# Patient Record
Sex: Female | Born: 1935 | Race: White | Hispanic: No
Health system: Southern US, Community
[De-identification: ages and names within clinical notes are randomized; demographics above are authoritative.]

---

## 2014-03-09 ENCOUNTER — Other Ambulatory Visit (HOSPITAL_COMMUNITY): Payer: Self-pay

## 2014-03-09 ENCOUNTER — Inpatient Hospital Stay
Admission: AD | Admit: 2014-03-09 | Discharge: 2014-03-28 | Disposition: A | Discharge status: Skilled Nursing Facility | Payer: Self-pay | Source: Ambulatory Visit | Attending: Internal Medicine | Admitting: Internal Medicine

## 2014-03-09 DIAGNOSIS — K259 Gastric ulcer, unspecified as acute or chronic, without hemorrhage or perforation: Secondary | ICD-10-CM

## 2014-03-09 DIAGNOSIS — R633 Feeding difficulties, unspecified: Secondary | ICD-10-CM

## 2014-03-09 DIAGNOSIS — T85598D Other mechanical complication of other gastrointestinal prosthetic devices, implants and grafts, subsequent encounter: Secondary | ICD-10-CM

## 2014-03-09 DIAGNOSIS — I639 Cerebral infarction, unspecified: Secondary | ICD-10-CM

## 2014-03-09 DIAGNOSIS — S064X9A Epidural hemorrhage with loss of consciousness of unspecified duration, initial encounter: Secondary | ICD-10-CM

## 2014-03-09 DIAGNOSIS — S064X1A Epidural hemorrhage with loss of consciousness of 30 minutes or less, initial encounter: Secondary | ICD-10-CM

## 2014-03-09 DIAGNOSIS — K2970 Gastritis, unspecified, without bleeding: Secondary | ICD-10-CM

## 2014-03-09 DIAGNOSIS — Z4659 Encounter for fitting and adjustment of other gastrointestinal appliance and device: Secondary | ICD-10-CM

## 2014-03-09 DIAGNOSIS — K2990 Gastroduodenitis, unspecified, without bleeding: Secondary | ICD-10-CM

## 2014-03-09 DIAGNOSIS — S0190XA Unspecified open wound of unspecified part of head, initial encounter: Secondary | ICD-10-CM

## 2014-03-09 DIAGNOSIS — T85598A Other mechanical complication of other gastrointestinal prosthetic devices, implants and grafts, initial encounter: Secondary | ICD-10-CM

## 2014-03-10 ENCOUNTER — Other Ambulatory Visit (HOSPITAL_COMMUNITY): Payer: Self-pay

## 2014-03-10 LAB — COMPREHENSIVE METABOLIC PANEL
ALBUMIN: 2.5 g/dL — AB (ref 3.5–5.2)
ALK PHOS: 113 U/L (ref 39–117)
ALT: 31 U/L (ref 0–35)
AST: 37 U/L (ref 0–37)
Anion gap: 12 (ref 5–15)
BILIRUBIN TOTAL: 0.9 mg/dL (ref 0.3–1.2)
BUN: 15 mg/dL (ref 6–23)
CO2: 22 mEq/L (ref 19–32)
Calcium: 8.3 mg/dL — ABNORMAL LOW (ref 8.4–10.5)
Chloride: 100 mEq/L (ref 96–112)
Creatinine, Ser: 0.56 mg/dL (ref 0.50–1.10)
GFR calc non Af Amer: 86 mL/min — ABNORMAL LOW (ref >90)
Glucose, Bld: 156 mg/dL — ABNORMAL HIGH (ref 70–99)
POTASSIUM: 4.4 meq/L (ref 3.7–5.3)
Sodium: 134 mEq/L — ABNORMAL LOW (ref 137–147)
TOTAL PROTEIN: 5.8 g/dL — AB (ref 6.0–8.3)

## 2014-03-10 LAB — CBC
HCT: 35 % — ABNORMAL LOW (ref 36.0–46.0)
Hemoglobin: 11.7 g/dL — ABNORMAL LOW (ref 12.0–15.0)
MCH: 31.7 pg (ref 26.0–34.0)
MCHC: 33.4 g/dL (ref 30.0–36.0)
MCV: 94.9 fL (ref 78.0–100.0)
Platelets: 288 10*3/uL (ref 150–400)
RBC: 3.69 MIL/uL — ABNORMAL LOW (ref 3.87–5.11)
RDW: 14.1 % (ref 11.5–15.5)
WBC: 11.8 10*3/uL — ABNORMAL HIGH (ref 4.0–10.5)

## 2014-03-10 LAB — TSH: TSH: 7.59 u[IU]/mL — ABNORMAL HIGH (ref 0.350–4.500)

## 2014-03-12 ENCOUNTER — Other Ambulatory Visit (HOSPITAL_COMMUNITY): Payer: Self-pay

## 2014-03-13 LAB — CBC
HCT: 36.5 % (ref 36.0–46.0)
Hemoglobin: 11.9 g/dL — ABNORMAL LOW (ref 12.0–15.0)
MCH: 31.7 pg (ref 26.0–34.0)
MCHC: 32.6 g/dL (ref 30.0–36.0)
MCV: 97.3 fL (ref 78.0–100.0)
PLATELETS: 313 10*3/uL (ref 150–400)
RBC: 3.75 MIL/uL — ABNORMAL LOW (ref 3.87–5.11)
RDW: 14.2 % (ref 11.5–15.5)
WBC: 8.6 10*3/uL (ref 4.0–10.5)

## 2014-03-13 LAB — BASIC METABOLIC PANEL
ANION GAP: 12 (ref 5–15)
BUN: 15 mg/dL (ref 6–23)
CO2: 23 mEq/L (ref 19–32)
CREATININE: 0.57 mg/dL (ref 0.50–1.10)
Calcium: 8.5 mg/dL (ref 8.4–10.5)
Chloride: 102 mEq/L (ref 96–112)
GFR calc Af Amer: >90 mL/min (ref >90)
GFR, EST NON AFRICAN AMERICAN: 85 mL/min — AB (ref >90)
Glucose, Bld: 123 mg/dL — ABNORMAL HIGH (ref 70–99)
POTASSIUM: 4.6 meq/L (ref 3.7–5.3)
Sodium: 137 mEq/L (ref 137–147)

## 2014-03-22 DIAGNOSIS — R633 Feeding difficulties, unspecified: Secondary | ICD-10-CM

## 2014-03-22 DIAGNOSIS — K259 Gastric ulcer, unspecified as acute or chronic, without hemorrhage or perforation: Secondary | ICD-10-CM

## 2014-03-22 NOTE — Consult Note (Signed)
[facility] Consult: 12:31 PM 03/22/2014  LOS: 13 days    Referring Provider: Dr Sharyon MedicusHijazi.   Primary Care Physician:  Pcp Not In System Primary Gastroenterologist:  [facility] consult .  Seen by Dr Octaviano GlowBadreddine at [facility].     Reason for Consultation:  Follow up of ulcers and place PEG.    HPI: Martha Casey is a 80 y.o. female.  History of hypothyroidism. History of atrial fibrillation but not on anticoagulation at time of presentation with acute CVA to outside hospital. She was admitted for the stroke as well as associated complications at outside hospital from 03/01/2014 until [facility]/09/[facility]  CT and MRI imaging of the brain confirmed multifocal acute infarcts left hemispheric involvement greater than right hemispheric involvement. While hospitalized she had dysphagia and associated aspiration pneumonia treated with 6 days of Zosyn. During the hospitalization she underwent EGD with the aim to place a PEG tube. However at the endoscopy she had a large gastric ulcer and 2 duodenal ulcers.  These precluded placement of PEG suggestion by GI was to treat the ulcers with twice daily PPI and repeat endoscopy in about 2 weeks to reassess the ulcers and possibly place PEG at that time. She has been on ongoing twice daily Protonix since the second week of November.  A serum H. pylori antibody was ordered and is in the negative range at less than 0.40.  Patient is aphasic and unable to speak, unable to follow commands. She is tolerating Dobbhoff tube feeds without problems. Contact number for a daughter found in the patient's chart is a Alain HoneyCheryl Dahr, cell phone number is [phone]. Work number is [phone]. Home phone is [phone]    Past medical  history Hypothyroidism Stroke likely embolic etiology from her atrial fibrillation. CVA associated encephalopathy. IDDM Peptic ulcer disease, 11 2015 EGD revealed large gastric and 2 duodenal ulcers. Atrial fibrillation, chronic not on anticoagulation. Neurogenic dysphasia following stroke in 11 2015 Aspiration pneumonia associated with neurogenic dysphagia   Past surgical history EGD in October 2015. 2-D echocardiogram in November 2015 at outside hospital showed normal EF, mild concentric LVH.  No other surgeries mentioned in her history  Prior to Admission medications   Not on File    Scheduled Meds: NovoLog insulin, atorvastatin, multivitamin, diltiazem, fish oil, levothyroxine, melatonin, metoprolol, modafinil, Protonix 40 mg via tube twice a day, probiotic Risa-BID.      Allergies as of 03/09/2014  . (Not on File)    No family history on file.  History   Unable to obtain any history from the patient.  Social History  . Marital Status: Widowed    Spouse Name: N/A    Number of Children: N/A  . Years of Education: N/A   Occupational History  . Not on file.   Social History Main Topics  . Smoking status: Not on file  . Smokeless tobacco: Not on file  . Alcohol Use: Not on file  . Drug Use: Not on file  . Sexual Activity: Not on file  REVIEW OF SYSTEMS: Unable to complete review of systems as the patient is aphasic  PHYSICAL EXAM: Vital signs in last 24 hours: Temperature 98.2. Pulse 100. Respirations 22. Blood pressure 146/70. Weight 124.6 # General: Thin, not cachectic, older white female who looks younger than her stated age. Head:  No signs of trauma, no asymmetry, no swelling  Eyes:  No icterus, no pallor Ears:  Unable to assess hearing though it seems to be intact she's just not following any commands  Nose:  No congestion or discharge there is a Dobbhoff tube in place Mouth:  She doesn't open her mouth at my request I did not pry it open for  exam.  No lesions on the lips and no dried blood at the external mouth Neck:  No JVD, no masses, no TMJ Lungs:  Clear to auscultation and percussion bilaterally. No cough and no labored breathing Heart: Irregularly irregular. No murmur rub or gallop Abdomen:  Soft, thin, no scars, no masses. Bowel sounds active. No organomegaly. No distention.   Rectal: Not performed   Musc/Skeltl: No joint swelling, redness or contracture. Extremities:  No pedal or lower extremity edema. Slight edema in the right upper extremity  Neurologic:  Flaccid right arm. Unable to assess strength on the left leg Tattoos:   None Nodes:   No cervical adenopathy   Psych:   Not anxious or agitated.  Intake/Output from previous day:   Intake/Output this shift:    LAB RESULTS: No results for input(s): WBC, HGB, HCT, PLT in the last 72 hours. BMET Lab Results  Component Value Date   NA 137 03/13/2014   NA 134* 03/10/2014   K 4.6 03/13/2014   K 4.4 03/10/2014   CL 102 03/13/2014   CL 100 03/10/2014   CO2 23 03/13/2014   CO2 22 03/10/2014   GLUCOSE 123* 03/13/2014   GLUCOSE 156* 03/10/2014   BUN 15 03/13/2014   BUN 15 03/10/2014   CREATININE 0.57 03/13/2014   CREATININE 0.56 03/10/2014   CALCIUM 8.5 03/13/2014   CALCIUM 8.3* 03/10/2014   LFT No results for input(s): PROT, ALBUMIN, AST, ALT, ALKPHOS, BILITOT, BILIDIR, IBILI in the last 72 hours. PT/INR No results found for: INR, PROTIME Hepatitis Panel No results for input(s): HEPBSAG, HCVAB, HEPAIGM, HEPBIGM in the last 72 hours. C-Diff No components found for: CDIFF Lipase  No results found for: LIPASE  Drugs of Abuse  No results found for: LABOPIA, COCAINSCRNUR, LABBENZ, AMPHETMU, THCU, LABBARB   RADIOLOGY STUDIES: No results found.  ENDOSCOPIC STUDIES: Per history of present illness  IMPRESSION:   *   Recent  Ulcer disease on EGD  *  Dysphagia.  MD requesting PEG    PLAN:     *  Will arrange for upper endoscopy the soonest we  could feasibly do this would be 1127 however it might be 1129 or 11:30 before we are able to perform this. There is no imminent discharge pending on this patient so we have several days to work out the endoscopy timing. *  Continue PPI via tube as doing   Jennye MoccasinSarah Gribbin  03/22/2014, 12:31 PM Pager: [phone]  GI Attending Note   Chart was reviewed and patient was examined. X-rays and lab were reviewed.    I agree with management and plans. Plan EGD/possible PEG on 11/27.  Barbette Hairobert D. Arlyce DiceKaplan, M.D., [facility] Cell [phone]

## 2014-03-23 LAB — CLOSTRIDIUM DIFFICILE BY PCR: CDIFFPCR: NEGATIVE

## 2014-03-24 ENCOUNTER — Encounter (HOSPITAL_COMMUNITY): Payer: Self-pay | Admitting: Anesthesiology

## 2014-03-24 ENCOUNTER — Encounter
Admission: AD | Disposition: A | Discharge status: Skilled Nursing Facility | Payer: Self-pay | Source: Ambulatory Visit | Attending: Internal Medicine

## 2014-03-24 ENCOUNTER — Encounter (HOSPITAL_COMMUNITY): Payer: Self-pay | Admitting: Gastroenterology

## 2014-03-24 DIAGNOSIS — I639 Cerebral infarction, unspecified: Secondary | ICD-10-CM

## 2014-03-24 DIAGNOSIS — K2970 Gastritis, unspecified, without bleeding: Secondary | ICD-10-CM

## 2014-03-24 DIAGNOSIS — K2990 Gastroduodenitis, unspecified, without bleeding: Secondary | ICD-10-CM

## 2014-03-24 HISTORY — PX: PEG PLACEMENT: SHX5437

## 2014-03-24 HISTORY — PX: ESOPHAGOGASTRODUODENOSCOPY: SHX5428

## 2014-03-24 SURGERY — EGD (ESOPHAGOGASTRODUODENOSCOPY)
Anesthesia: Monitor Anesthesia Care

## 2014-03-24 MED ORDER — SODIUM CHLORIDE 0.9 % IV SOLN
3.0000 g | INTRAVENOUS | Status: DC | PRN
  Administered 2014-03-24: 1.5 g via INTRAVENOUS

## 2014-03-24 MED ORDER — PROPOFOL INFUSION 10 MG/ML OPTIME
INTRAVENOUS | Status: DC | PRN
  Administered 2014-03-24: 75 ug/kg/min via INTRAVENOUS

## 2014-03-24 MED ORDER — LACTATED RINGERS IV SOLN
INTRAVENOUS | Status: DC | PRN
  Administered 2014-03-24: 13:00:00 via INTRAVENOUS

## 2014-03-24 MED ORDER — FENTANYL CITRATE 0.05 MG/ML IJ SOLN
INTRAMUSCULAR | Status: DC | PRN
  Administered 2014-03-24 (×2): 25 ug via INTRAVENOUS

## 2014-03-24 MED ORDER — PROPOFOL 10 MG/ML IV BOLUS: INTRAVENOUS | Status: DC | PRN

## 2014-03-24 MED ORDER — SODIUM CHLORIDE 0.9 % IV SOLN: INTRAVENOUS | Status: DC

## 2014-03-24 NOTE — Interval H&P Note (Signed)
History and Physical Interval Note:  03/24/2014 1:25 PM  Martha CurryKathleen R Whinery  has presented today for surgery, with the diagnosis of Ulcer disease needs reevaluation.  If ulcer disease has regressed, M.D. Will place gastrostomy tube  The various methods of treatment have been discussed with the patient and family. After consideration of risks, benefits and other options for treatment, the patient has consented to  Procedure(s): ESOPHAGOGASTRODUODENOSCOPY (EGD) (N/A) PERCUTANEOUS ENDOSCOPIC GASTROSTOMY (PEG) PLACEMENT (N/A) as a surgical intervention .  The patient's history has been reviewed, patient examined, no change in status, stable for surgery.  I have reviewed the patient's chart and labs.  Questions were answered to the patient's satisfaction.    The recent H&P (dated *03/22/14**) was reviewed, the patient was examined and there is no change in the patients condition since that H&P was completed.   [name]  03/24/2014, 1:25 PM   [name]

## 2014-03-24 NOTE — Transfer of Care (Signed)
Immediate Anesthesia Transfer of Care Note  Patient: Donnalee CurryKathleen R Tarr  Procedure(s) Performed: Procedure(s): ESOPHAGOGASTRODUODENOSCOPY (EGD) (N/A) PERCUTANEOUS ENDOSCOPIC GASTROSTOMY (PEG) PLACEMENT (N/A)  Patient Location: Endoscopy Unit  Anesthesia Type:MAC  Level of Consciousness: awake  Airway & Oxygen Therapy: Patient Spontanous Breathing  Post-op Assessment: Report given to PACU RN  Post vital signs: Reviewed and stable  Complications: No apparent anesthesia complications

## 2014-03-24 NOTE — H&P (View-Only) (Signed)
[facility] Consult: 12:31 PM 03/22/2014  LOS: 13 days    Referring Provider: Dr Sharyon MedicusHijazi.   Primary Care Physician:  Pcp Not In System Primary Gastroenterologist:  [facility] consult .  Seen by Dr Octaviano GlowBadreddine at [facility].     Reason for Consultation:  Follow up of ulcers and place PEG.    HPI: Martha Casey is a 80 y.o. female.  History of hypothyroidism. History of atrial fibrillation but not on anticoagulation at time of presentation with acute CVA to outside hospital. She was admitted for the stroke as well as associated complications at outside hospital from 03/01/2014 until [facility]/09[facility]  CT and MRI imaging of the brain confirmed multifocal acute infarcts left hemispheric involvement greater than right hemispheric involvement. While hospitalized she had dysphagia and associated aspiration pneumonia treated with 6 days of Zosyn. During the hospitalization she underwent EGD with the aim to place a PEG tube. However at the endoscopy she had a large gastric ulcer and 2 duodenal ulcers.  These precluded placement of PEG suggestion by GI was to treat the ulcers with twice daily PPI and repeat endoscopy in about 2 weeks to reassess the ulcers and possibly place PEG at that time. She has been on ongoing twice daily Protonix since the second week of November.  A serum H. pylori antibody was ordered and is in the negative range at less than 0.40.  Patient is aphasic and unable to speak, unable to follow commands. She is tolerating Dobbhoff tube feeds without problems. Contact number for a daughter found in the patient's chart is a Alain HoneyCheryl Dahr, cell phone number is [phone]. Work number is [phone]. Home phone is [phone]    Past medical  history Hypothyroidism Stroke likely embolic etiology from her atrial fibrillation. CVA associated encephalopathy. IDDM Peptic ulcer disease, 11 2015 EGD revealed large gastric and 2 duodenal ulcers. Atrial fibrillation, chronic not on anticoagulation. Neurogenic dysphasia following stroke in 11 2015 Aspiration pneumonia associated with neurogenic dysphagia   Past surgical history EGD in October 2015. 2-D echocardiogram in November 2015 at outside hospital showed normal EF, mild concentric LVH.  No other surgeries mentioned in her history  Prior to Admission medications   Not on File    Scheduled Meds: NovoLog insulin, atorvastatin, multivitamin, diltiazem, fish oil, levothyroxine, melatonin, metoprolol, modafinil, Protonix 40 mg via tube twice a day, probiotic Risa-BID.      Allergies as of 03/09/2014  . (Not on File)    No family history on file.  History   Unable to obtain any history from the patient.  Social History  . Marital Status: Widowed    Spouse Name: N/A    Number of Children: N/A  . Years of Education: N/A   Occupational History  . Not on file.   Social History Main Topics  . Smoking status: Not on file  . Smokeless tobacco: Not on file  . Alcohol Use: Not on file  . Drug Use: Not on file  . Sexual Activity: Not on file  REVIEW OF SYSTEMS: Unable to complete review of systems as the patient is aphasic  PHYSICAL EXAM: Vital signs in last 24 hours: Temperature 98.2. Pulse 100. Respirations 22. Blood pressure 146/70. Weight 124.6 # General: Thin, not cachectic, older white female who looks younger than her stated age. Head:  No signs of trauma, no asymmetry, no swelling  Eyes:  No icterus, no pallor Ears:  Unable to assess hearing though it seems to be intact she's just not following any commands  Nose:  No congestion or discharge there is a Dobbhoff tube in place Mouth:  She doesn't open her mouth at my request I did not pry it open for  exam.  No lesions on the lips and no dried blood at the external mouth Neck:  No JVD, no masses, no TMJ Lungs:  Clear to auscultation and percussion bilaterally. No cough and no labored breathing Heart: Irregularly irregular. No murmur rub or gallop Abdomen:  Soft, thin, no scars, no masses. Bowel sounds active. No organomegaly. No distention.   Rectal: Not performed   Musc/Skeltl: No joint swelling, redness or contracture. Extremities:  No pedal or lower extremity edema. Slight edema in the right upper extremity  Neurologic:  Flaccid right arm. Unable to assess strength on the left leg Tattoos:   None Nodes:   No cervical adenopathy   Psych:   Not anxious or agitated.  Intake/Output from previous day:   Intake/Output this shift:    LAB RESULTS: No results for input(s): WBC, HGB, HCT, PLT in the last 72 hours. BMET Lab Results  Component Value Date   NA 137 03/13/2014   NA 134* 03/10/2014   K 4.6 03/13/2014   K 4.4 03/10/2014   CL 102 03/13/2014   CL 100 03/10/2014   CO2 23 03/13/2014   CO2 22 03/10/2014   GLUCOSE 123* 03/13/2014   GLUCOSE 156* 03/10/2014   BUN 15 03/13/2014   BUN 15 03/10/2014   CREATININE 0.57 03/13/2014   CREATININE 0.56 03/10/2014   CALCIUM 8.5 03/13/2014   CALCIUM 8.3* 03/10/2014   LFT No results for input(s): PROT, ALBUMIN, AST, ALT, ALKPHOS, BILITOT, BILIDIR, IBILI in the last 72 hours. PT/INR No results found for: INR, PROTIME Hepatitis Panel No results for input(s): HEPBSAG, HCVAB, HEPAIGM, HEPBIGM in the last 72 hours. C-Diff No components found for: CDIFF Lipase  No results found for: LIPASE  Drugs of Abuse  No results found for: LABOPIA, COCAINSCRNUR, LABBENZ, AMPHETMU, THCU, LABBARB   RADIOLOGY STUDIES: No results found.  ENDOSCOPIC STUDIES: Per history of present illness  IMPRESSION:   *   Recent  Ulcer disease on EGD  *  Dysphagia.  MD requesting PEG    PLAN:     *  Will arrange for upper endoscopy the soonest we  could feasibly do this would be 1127 however it might be 1129 or 11:30 before we are able to perform this. There is no imminent discharge pending on this patient so we have several days to work out the endoscopy timing. *  Continue PPI via tube as doing   Sarah Gribbin  03/22/2014, 12:31 PM Pager: [phone]  GI Attending Note   Chart was reviewed and patient was examined. X-rays and lab were reviewed.    I agree with management and plans. Plan EGD/possible PEG on 11/27.  Gelila Well D. [place] Perra, M.D., FACG [facility] Cell [phone]       

## 2014-03-24 NOTE — Op Note (Signed)
[facility] [address] [place], [id number]   EGD WITH PEG PROCEDURE REPORT        EXAM DATE: 03/24/2014  PATIENT NAME:          Martha Casey, Martha Casey          MR #: [id number] BIRTHDATE:       11-Mar-1931     VISIT #:     [id number] ATTENDING:     Louis Meckelobert D Landen Breeland, MD     STATUS:     inpatient ASSISTANT:      Dwain SarnaFord, Patricia and Oletha BlendShoffner, Davida  INDICATIONS:  The patient is a 80 yr old female here for an EGD with PEG due to feeding difficulties. PROCEDURE PERFORMED:     EGD w/ percutaneous gastrostomy tube placement  MEDICATIONS:     Monitored anesthesia care TOPICAL ANESTHETIC:  CONSENT: The patient understands the risks and benefits of the procedure and understands that these risks include, but are not limited to: sedation, allergic reaction, infection, perforation and/or bleeding. Alternative means of evaluation and treatment include, among others: physical exam, x-rays, and/or surgical intervention. The patient elects to proceed with this endoscopic procedure.  DESCRIPTION OF PROCEDURE: During intra-op preparation period all mechanical & medical equipment was checked for proper function. Hand hygiene and appropriate measures for infection prevention was taken. After the risks, benefits and alternatives of the procedure were thoroughly explained, Informed consent was verified, confirmed and timeout was successfully executed by the treatment team. The patient was anesthetized with topical anesthesia and the    endoscope was introduced through the mouth and advanced to the second portion of the duodenum.  The instrument was slowly withdrawn as the mucosa was fully examined.  STOMACH: Congested chronic gastritis (inflammation) was found in the gastric fundus.   Except for the findings listed the EGD was otherwise normal. The stomach was then inflated with air, and by a combination of transillumination and manual palpation, the site for  the gastrostomy tube placement was selected and marked on the anterior abdominal wall.  The skin of the anterior abdomen was surgically prepped and draped with sterile towels.  Utilizing strict sterile technique, the selected site was then anesthetized with 1% xylocaine by injection into the skin and subcutaneous tissue.  A 1 cm incision was made through the skin and subcutaneous tissue, and the needle/cannula assembly was then passed through the abdominal wall and through the anterior wall of the stomach, maintaining visualization with the endoscope.  A snare device previously placed through the instrument channel was then opened and placed around the cannula, the needle was removed, and the insertion wire was passed through the cannula and into the stomach lumen.  The snare was then loosened from the cannula, and repositioned to snare the insertion wire.  The snare was then pulled up to the endoscope distal tip, and the scope was then withdrawn bringing with it the snare and insertion wire.  The insertion wire was then released from the snare, and then loop-attached to the Bard 22 Fr gastrostomy tube.  Using the "pull technique", the G-tube was then pulled into place by traction on the insertion wire at the abdominal wall end.        The G-tube insertion site was then cleansed once again, and the external bolster was placed over the tube to secure it to the abdominal wall.  A sterile dressing was then applied, and the procedure terminated. Retroflexed views revealed no abnormalities.  The gastroscope was  then slowly withdrawn and removed.    ADVERSE EVENT:     There were no immediate complications. IMPRESSIONS:     1.  Chronic gastritis (inflammation) was found in the gastric fundus 2.  EGD was otherwise normal 3.  s/p PEG placement  RECOMMENDATIONS:     Resume tube feedings in am REPEAT EXAM:   ___________________________________ Louis Meckelobert D Bradon Fester, MD eSigned:  Louis Meckelobert D  Pansie Guggisberg, MD 03/24/2014 2:23 PM   cc:  CPT CODES: ICD CODES:  The ICD and CPT codes recommended by this software are interpretations from the data that the clinical staff has captured with the software.  The verification of the translation of this report to the ICD and CPT codes and modifiers is the sole responsibility of the health care institution and practicing physician where this report was generated.  [employer] will not be held responsible for the validity of the ICD and CPT codes included on this report.  AMA assumes no liability for data contained or not contained herein. CPT is a Publishing rights managerregistered trademark of the [employer].  PATIENT NAME:  Martha Casey, Martha Casey MR#: [id number]

## 2014-03-24 NOTE — Anesthesia Preprocedure Evaluation (Addendum)
Anesthesia Evaluation  Patient identified by MRN, date of birth, ID band Patient awake and Patient confused  General Assessment Comment:noncommunicative after CVA  Airway Mallampati: III       Dental   Pulmonary  Hx of aspiration pneumonia   + decreased breath sounds      Cardiovascular + dysrhythmias Atrial Fibrillation Rhythm:Irregular Rate:Tachycardia  Apparently untreated before and unclear [facility]   Neuro/Psych CVA, Residual Symptoms    GI/Hepatic PUD, Tube feedings   Endo/Other    Renal/GU      Musculoskeletal   Abdominal   Peds  Hematology   Anesthesia Other Findings   Reproductive/Obstetrics                             Anesthesia Physical Anesthesia Plan  ASA: IV  Anesthesia Plan: MAC   Post-op Pain Management:    Induction: Intravenous  Airway Management Planned: Natural Airway  Additional Equipment:   Intra-op Plan:   Post-operative Plan:   Informed Consent: I have reviewed the patients History and Physical, chart, labs and discussed the procedure including the risks, benefits and alternatives for the proposed anesthesia with the patient or authorized representative who has indicated his/her understanding and acceptance.     Plan Discussed with: CRNA, Surgeon and Anesthesiologist  Anesthesia Plan Comments: (Plan discussed c son in attendance including possiblity of intubation if airway issue encounterd. Plan sedation.)      Anesthesia Quick Evaluation

## 2014-03-24 NOTE — Anesthesia Postprocedure Evaluation (Signed)
  Anesthesia Post-op Note  Patient: Martha Casey  Procedure(s) Performed: Procedure(s): ESOPHAGOGASTRODUODENOSCOPY (EGD) (N/A) PERCUTANEOUS ENDOSCOPIC GASTROSTOMY (PEG) PLACEMENT (N/A)  Patient Location: Endoscopy Unit  Anesthesia Type:MAC  Level of Consciousness: awake  Airway and Oxygen Therapy: Patient Spontanous Breathing and Patient connected to nasal cannula oxygen  Post-op Pain: none  Post-op Assessment: Post-op Vital signs reviewed  Post-op Vital Signs: Reviewed and stable  Last Vitals:  Filed Vitals:   03/24/14 1336  BP:   Pulse: 92  Resp: 15    Complications: No apparent anesthesia complications

## 2014-03-24 NOTE — Progress Notes (Signed)
PEG placed.  May resume feedings in am.

## 2014-03-26 LAB — CBC
HCT: 35 % — ABNORMAL LOW (ref 36.0–46.0)
HEMOGLOBIN: 11.2 g/dL — AB (ref 12.0–15.0)
MCH: 30 pg (ref 26.0–34.0)
MCHC: 32 g/dL (ref 30.0–36.0)
MCV: 93.8 fL (ref 78.0–100.0)
Platelets: 235 10*3/uL (ref 150–400)
RBC: 3.73 MIL/uL — AB (ref 3.87–5.11)
RDW: 14.9 % (ref 11.5–15.5)
WBC: 9.2 10*3/uL (ref 4.0–10.5)

## 2014-03-26 LAB — BASIC METABOLIC PANEL
Anion gap: 13 (ref 5–15)
BUN: 19 mg/dL (ref 6–23)
CO2: 26 meq/L (ref 19–32)
Calcium: 8.9 mg/dL (ref 8.4–10.5)
Chloride: 95 mEq/L — ABNORMAL LOW (ref 96–112)
Creatinine, Ser: 0.5 mg/dL (ref 0.50–1.10)
GFR calc Af Amer: >90 mL/min (ref >90)
GFR calc non Af Amer: 89 mL/min — ABNORMAL LOW (ref >90)
Glucose, Bld: 134 mg/dL — ABNORMAL HIGH (ref 70–99)
POTASSIUM: 4.2 meq/L (ref 3.7–5.3)
SODIUM: 134 meq/L — AB (ref 137–147)

## 2014-03-26 LAB — PROTIME-INR
INR: 1.23 (ref 0.00–1.49)
PROTHROMBIN TIME: 15.7 s — AB (ref 11.6–15.2)

## 2014-03-27 ENCOUNTER — Encounter (HOSPITAL_COMMUNITY): Payer: Self-pay | Admitting: Gastroenterology

## 2014-03-27 ENCOUNTER — Other Ambulatory Visit (HOSPITAL_COMMUNITY): Payer: Self-pay

## 2014-05-29 DEATH — deceased

## 2015-07-22 IMAGING — CR DG ABD PORTABLE 1V
1 series · 1 of 1 positions shown · non-contrast
Comparison: 03/09/2014

CLINICAL DATA: Feeding tube blocked

EXAM:
PORTABLE ABDOMEN - 1 VIEW

[AP]
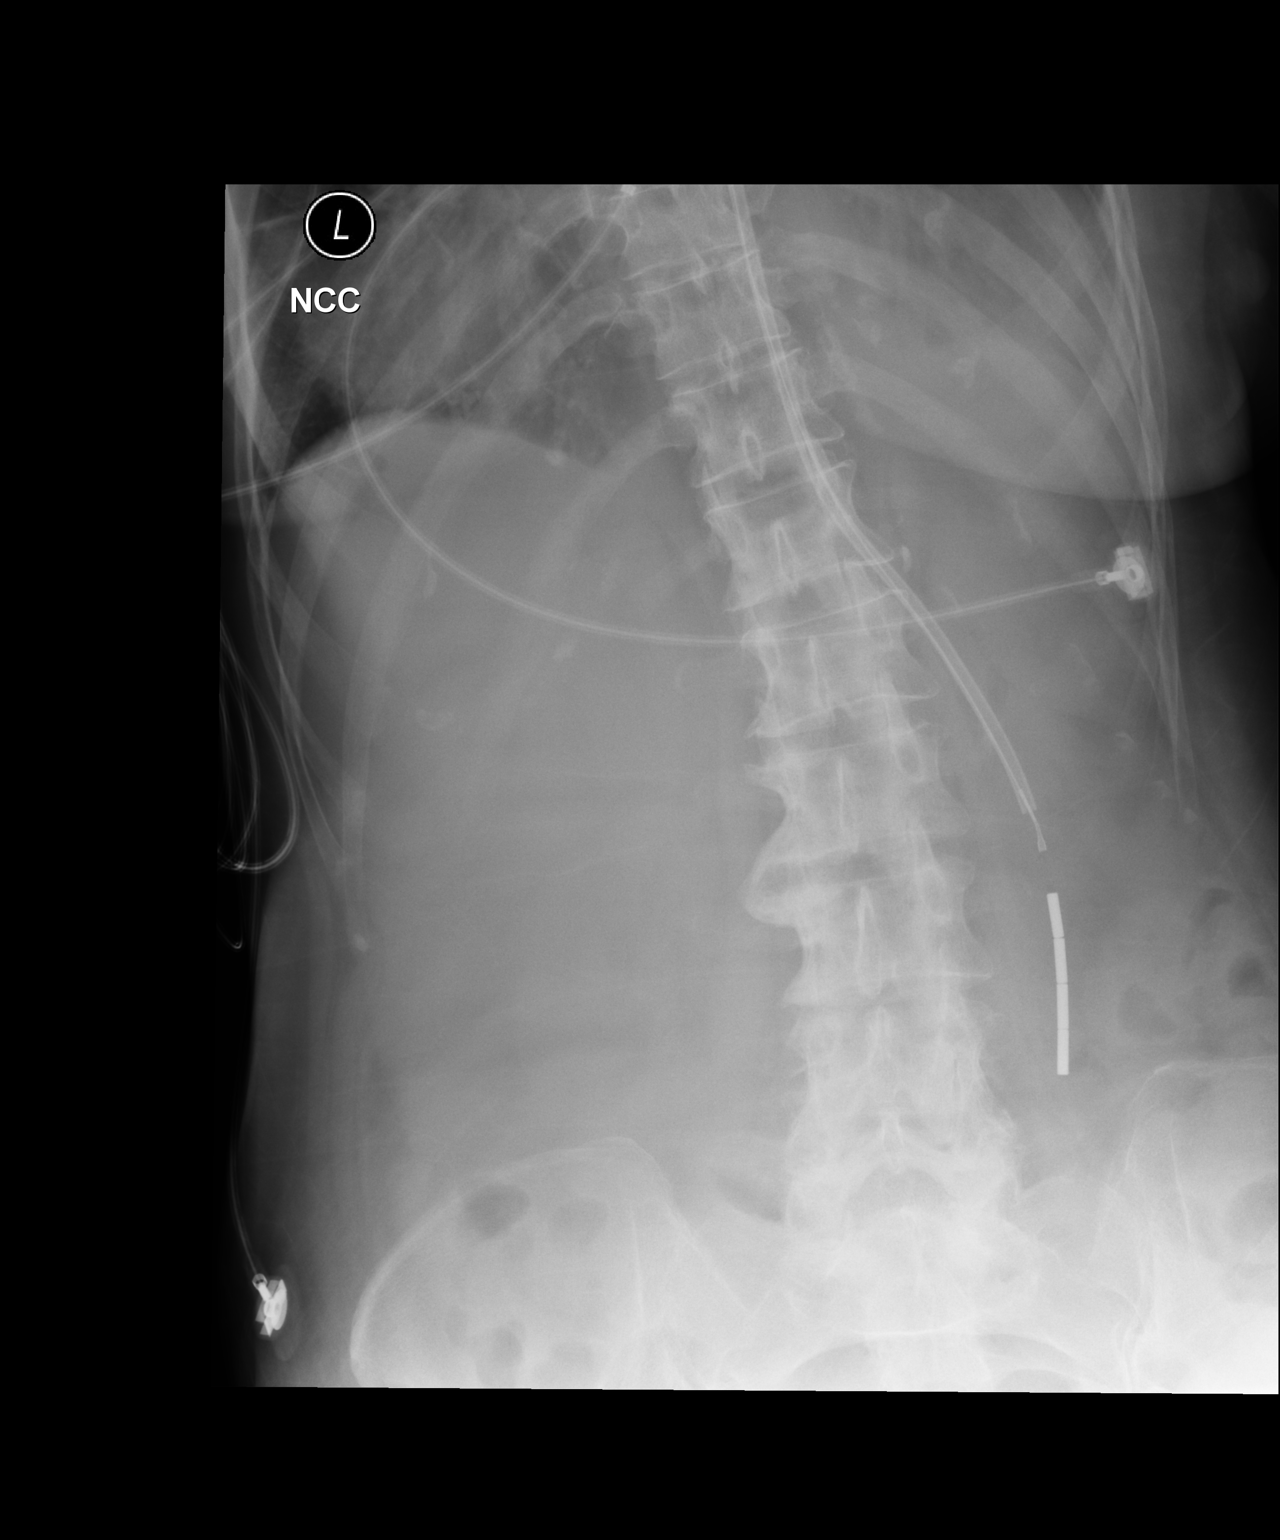

[1 of 1 positions shown; findings below may reference images not displayed]

FINDINGS: There is a feeding tube present with the metallic portion projecting
over the body of the stomach. There is a relative paucity of bowel
gas. There is no bowel dilatation to suggest obstruction. There is
no evidence of pneumoperitoneum, portal venous gas or pneumatosis.
There are no pathologic calcifications along the expected course of
the ureters.The osseous structures are unremarkable.
IMPRESSION: Feeding tube with the metallic portion projecting over these body of
the stomach.

## 2015-08-06 IMAGING — RF DG SWALLOWING FUNCTION - NRPT [facility]
1 series · 14 of 24 positions shown · non-contrast
Comparison: none

[Series 1: run · 13 acquisitions, 14 frames shown]
[im 1/13]
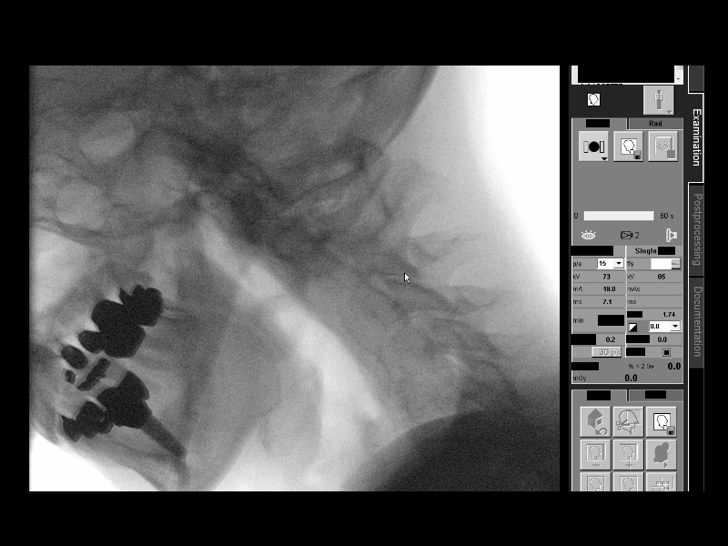
[im 2/13]
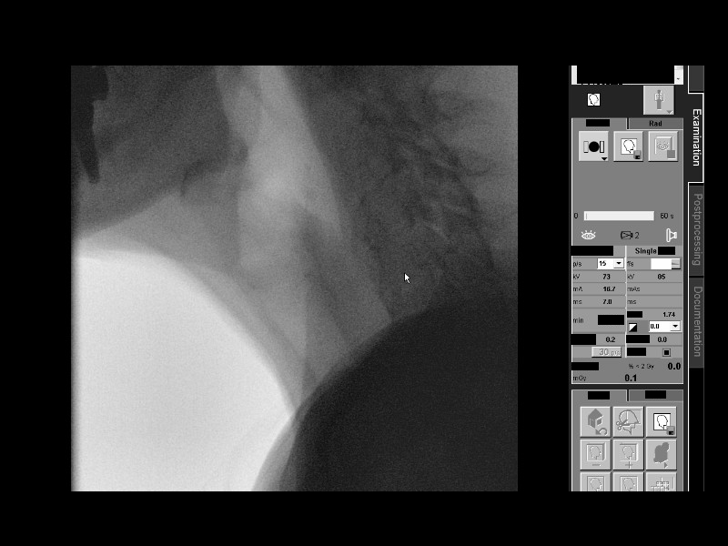
[im 3/13]
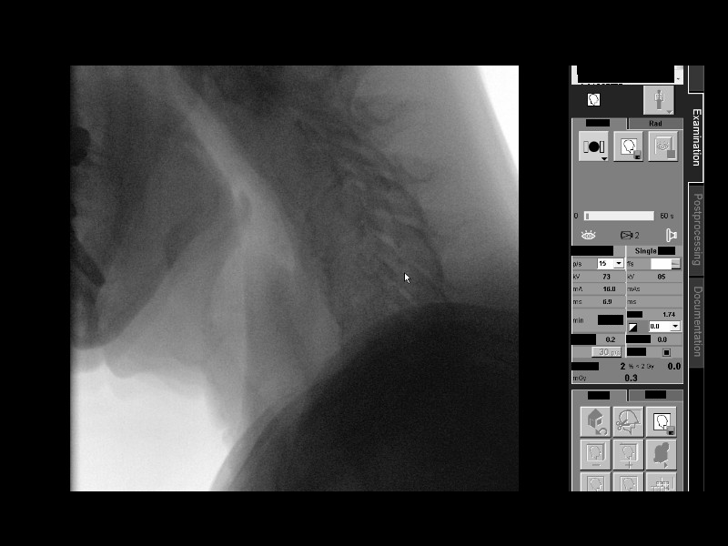
[im 4/13]
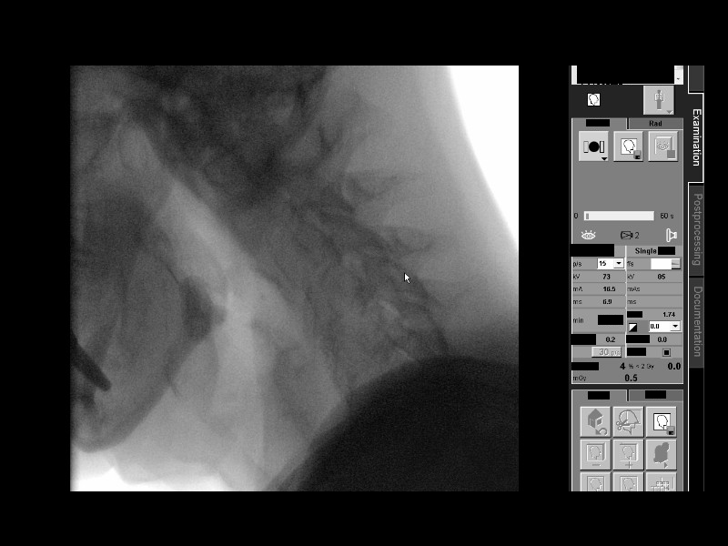
[im 5/13]
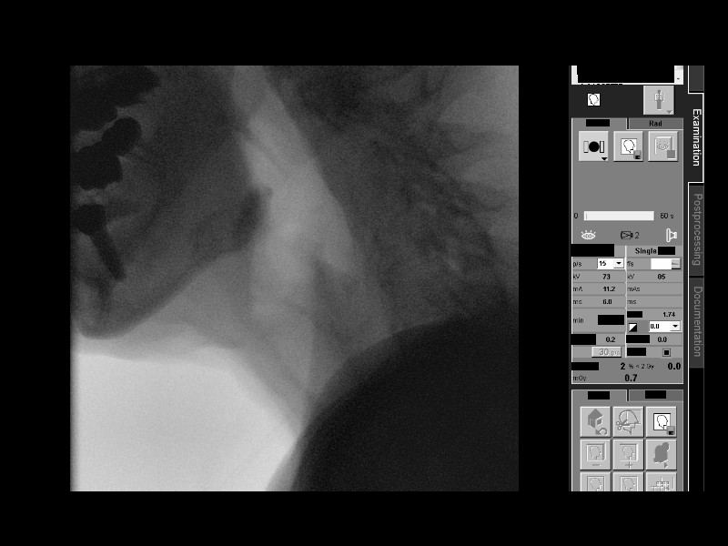
[im 6/13]
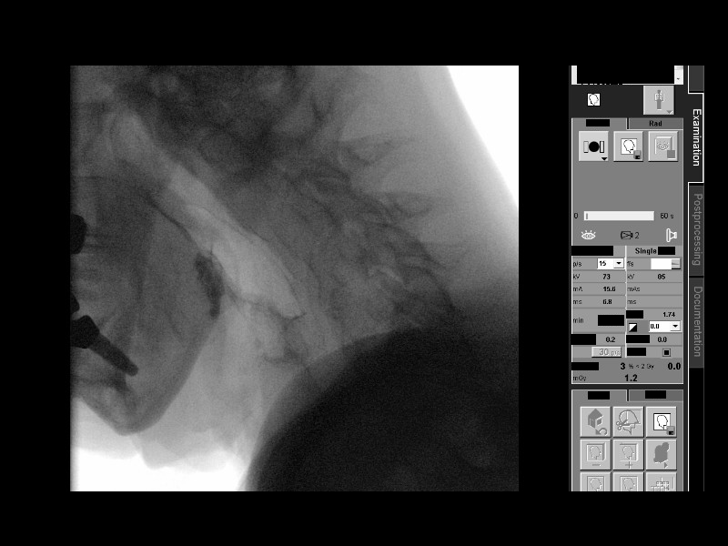
[im 7/13]
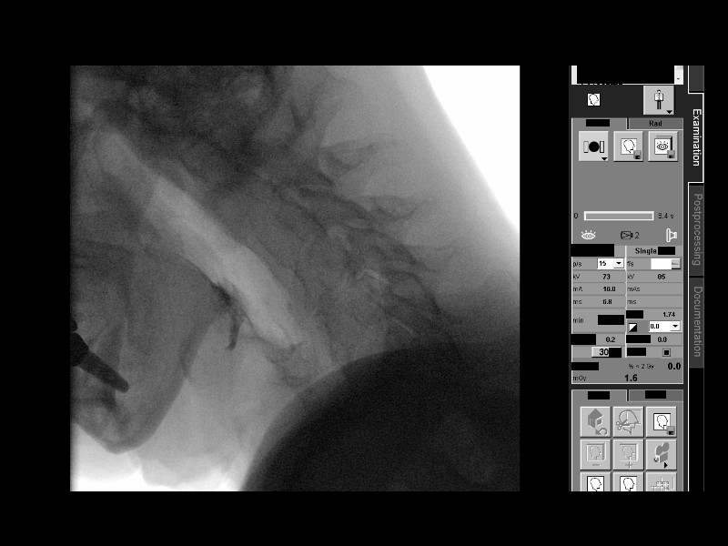
[im 7/13]
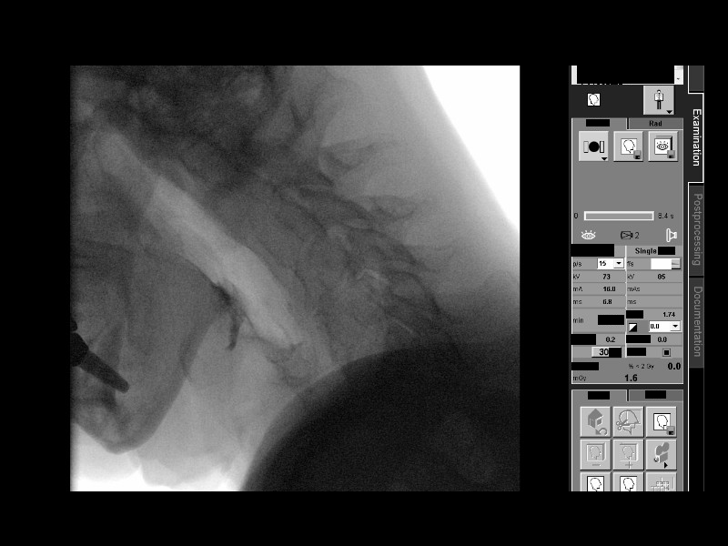
[im 8/13]
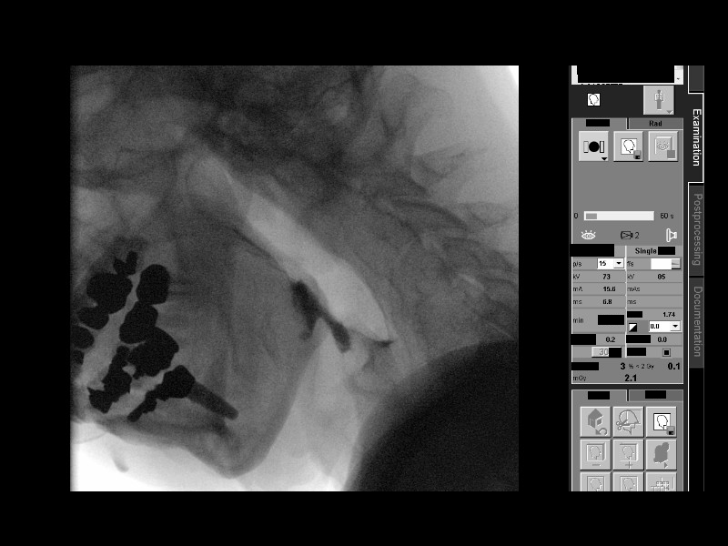
[im 9/13]
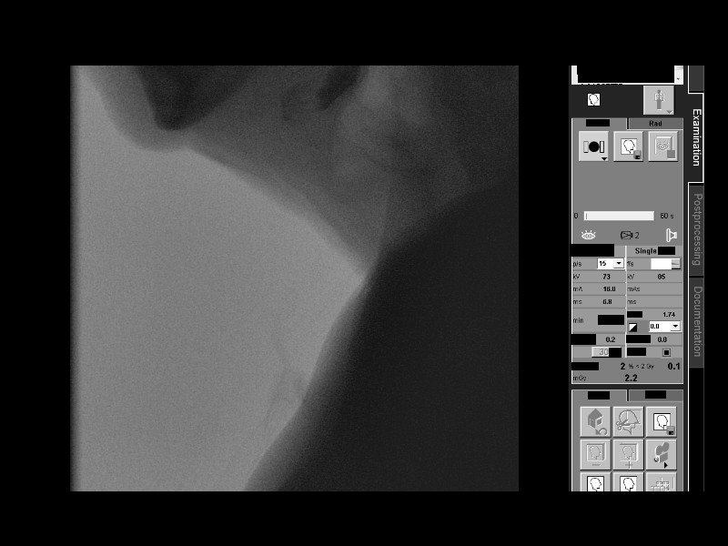
[im 11/13]
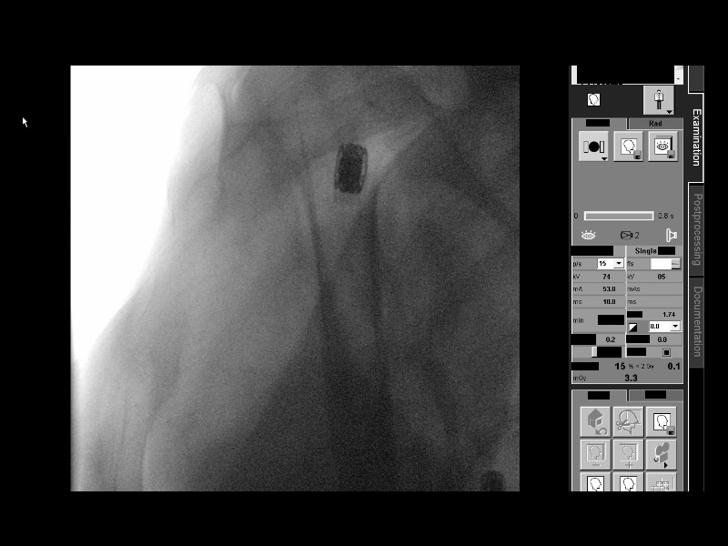
[im 11/13]
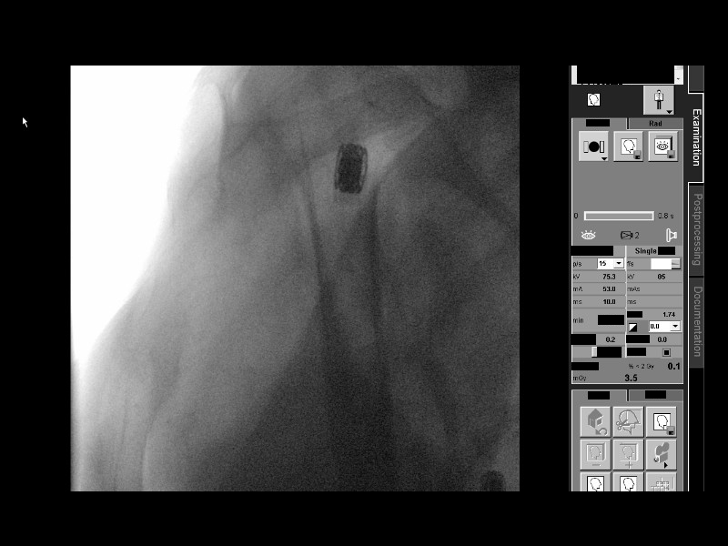
[im 12/13]
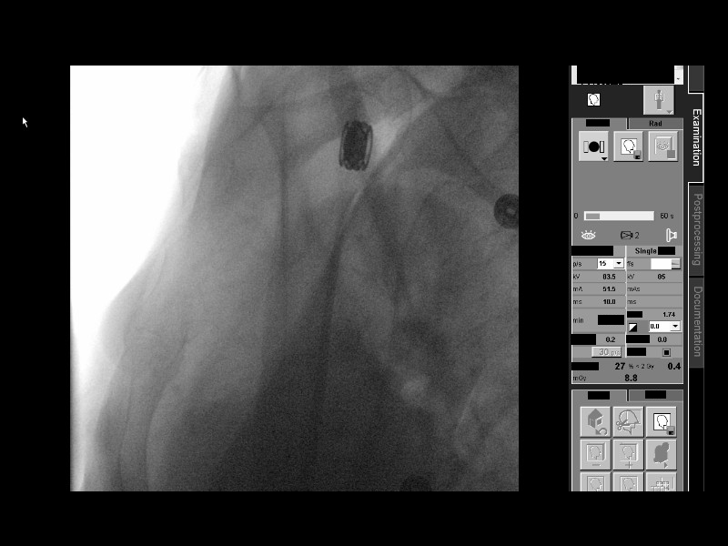
[im 13/13]
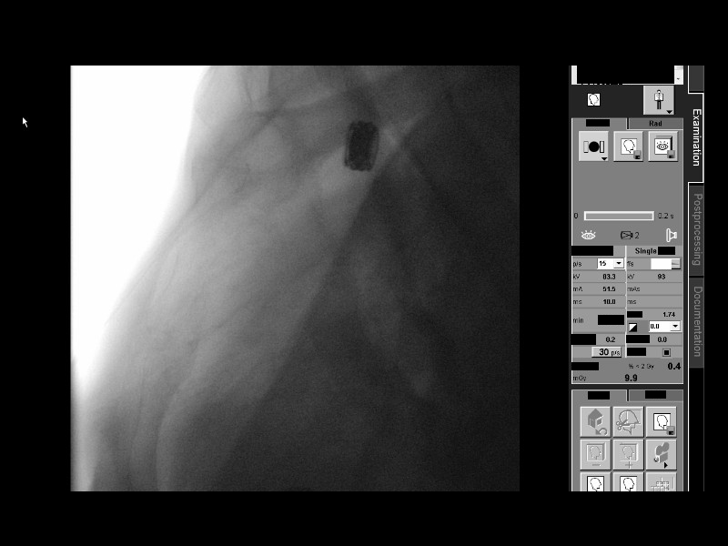

[14 of 24 positions shown; findings below may reference images not displayed]

Canned report from images found in remote index.

Refer to host system for actual result text.
